# Patient Record
Sex: Female | Born: 1940 | Race: White | Hispanic: No | Marital: Married
Health system: Midwestern US, Community
[De-identification: ages and names within clinical notes are randomized; demographics above are authoritative.]

---

## 2017-07-17 ENCOUNTER — Emergency Department: Admit: 2017-07-17 | Payer: MEDICARE

## 2017-07-17 ENCOUNTER — Inpatient Hospital Stay
Admit: 2017-07-17 | Discharge: 2017-07-17 | Disposition: A | Discharge status: Home / Self Care | Payer: MEDICARE | Attending: Emergency Medicine

## 2017-07-17 DIAGNOSIS — S42291A Other displaced fracture of upper end of right humerus, initial encounter for closed fracture: Secondary | ICD-10-CM

## 2017-07-17 MED ORDER — OXYCODONE-ACETAMINOPHEN 5 MG-325 MG TAB
5-325 mg | ORAL_TABLET | ORAL | 0 refills | Status: AC
Start: 2017-07-17 — End: ?

## 2017-07-17 MED ORDER — OXYCODONE-ACETAMINOPHEN 5 MG-325 MG TAB
5-325 mg | ORAL | Status: AC
Start: 2017-07-17 — End: 2017-07-17
  Administered 2017-07-17: 21:00:00 via ORAL

## 2017-07-17 MED FILL — OXYCODONE-ACETAMINOPHEN 5 MG-325 MG TAB: 5-325 mg | ORAL | Qty: 1

## 2017-07-17 NOTE — ED Provider Notes (Addendum)
HPI     Pt is 76 yo female with PMH sig for bilateral breast ca who presents to ED c/o right upper arm/shoulder pain s/p mechanical trip and fall onto the ground today. She notes pain with attempting to move her right shoulder. She denies other injuries, numbness, tingling, weakness, color changes to extremity. Pt states she took OTC pain killers before arrival to ED.     Past Medical History:   Diagnosis Date   ??? Cancer (HCC)     BREAST- BIL   ??? Hypothyroid        Past Surgical History:   Procedure Laterality Date   ??? HX BREAST LUMPECTOMY     ??? HX HYSTERECTOMY           Family History:   Problem Relation Age of Onset   ??? Cancer Father    ??? Cancer Brother    ??? Cancer Maternal Uncle    ??? Cancer Paternal Uncle        Social History     Social History   ??? Marital status: MARRIED     Spouse name: N/A   ??? Number of children: N/A   ??? Years of education: N/A     Occupational History   ??? Not on file.     Social History Main Topics   ??? Smoking status: Never Smoker   ??? Smokeless tobacco: Never Used   ??? Alcohol use Yes      Comment: OCCATIONALLY   ??? Drug use: No   ??? Sexual activity: Not on file     Other Topics Concern   ??? Not on file     Social History Narrative   ??? No narrative on file         ALLERGIES: Review of patient's allergies indicates no known allergies.    Review of Systems   Constitutional: Negative for chills and fatigue.   Musculoskeletal: Positive for arthralgias ( right upper arm and shoulder pain). Negative for back pain, gait problem, joint swelling, myalgias, neck pain and neck stiffness.   Skin: Negative for color change, pallor, rash and wound.   Neurological: Negative for seizures, syncope, weakness, light-headedness and numbness.       Vitals:    07/17/17 1520   BP: 111/56   Pulse: 77   Resp: 16   Temp: 98 ??F (36.7 ??C)   SpO2: 98%   Weight: 69.4 kg (153 lb)   Height: 5\' 3"  (1.6 m)            Physical Exam   Constitutional: She is oriented to person, place, and time. She appears  well-developed and well-nourished.   HENT:   Head: Normocephalic and atraumatic.   Cardiovascular: Normal rate, regular rhythm and intact distal pulses.    Musculoskeletal: Normal range of motion. She exhibits tenderness ( +ttp to right proximal humerus). She exhibits no edema or deformity.   Neurological: She is alert and oriented to person, place, and time. She has normal strength. No sensory deficit. Coordination normal. GCS eye subscore is 4. GCS verbal subscore is 5. GCS motor subscore is 6.   Skin: No rash noted. No erythema. No pallor.   Nursing note and vitals reviewed.       MDM  Number of Diagnoses or Management Options  Other closed displaced fracture of proximal end of right humerus, initial encounter: minor     Amount and/or Complexity of Data Reviewed  Tests in the radiology section of CPT??: ordered and reviewed  Discussion of test results with the performing providers: yes  Decide to obtain previous medical records or to obtain history from someone other than the patient: yes  Discuss the patient with other providers: yes  Independent visualization of images, tracings, or specimens: yes    Risk of Complications, Morbidity, and/or Mortality  Presenting problems: minimal  Diagnostic procedures: minimal  Management options: minimal    Patient Progress  Patient progress: stable        ED Course       Procedures    I, Aline August, PA, reviewed the patient's past history, allergies and home medications as documented in the nursing chart.    Radiology:  XR SHOULDER RT AP/LAT MIN 2 V (Final result) Result time: 07/17/17 16:26:18   ?? Final result by Lane Hacker, MD (07/17/17 16:26:18)   ?? Impression:   ?? impression:    Fracture proximal humerus   ?? Narrative:   ?? Right shoulder 2 views clinical indication trauma    Study again demonstrates a transverse fracture of the proximal humerus     ??    ??    ?? XR HUMERUS RT (Final result) Result time: 07/17/17 16:25:10    ?? Final result by Lane Hacker, MD (07/17/17 16:25:10)   ?? Impression:   ?? IMPRESSION:    Fracture of proximal humerus   ?? Narrative:   ?? Right humerus 2 views clinical indication trauma    Study demonstrates a transverse fracture of the proximal humeral shaft with  overriding of fracture fragments. The humeral head appears to be within the the  shoulder joint space        <EMERGENCY DEPARTMENT CASE SUMMARY>    Impression/Differential Diagnosis: contusion of arm, sprain of arm, r/o fracture of arm    ED Course: x-ray, sling for comfort, reassess    X-ray shows fracture of proximal humerus. There is displacement of fracture fragments.     Spoke to Dr. Maryelizabeth Kaufmann from orthopedics who states she will likely need surgery to fix the fracture but she can be sent home with pain medication and shoulder immobilizer. Pt placed in immobilizer and given one dose of percocet in ED for pain, will rx more pain medication for the next few days.     Instructed pt return to ED immediately for numbness, tingling, color changes to extremity.    10:57 PM: Patient was reassessed prior to discharge. Patient???s symptoms Improved. I personally discussed test results with patient/guardian, who understands instructions. All questions were answered.  Patient/guardian advised to follow up with PMD and/or specialist. Patient/guardian instructed to return to the ER if symptoms persist, change or worsen. Patient agrees with plan.      Final Impression/Diagnosis:   Encounter Diagnoses     ICD-10-CM ICD-9-CM   1. Other closed displaced fracture of proximal end of right humerus, initial encounter S42.291A 812.09       Patient condition at time of disposition: improved    I have reviewed the following home medications:    Prior to Admission medications    Medication Sig Start Date End Date Taking? Authorizing Provider   aspirin 81 mg chewable tablet Take 81 mg by mouth daily.   Yes Phys Other, MD    magnesium 250 mg tab Take 250 mg by mouth daily.   Yes Phys Other, MD   levothyroxine (LEVOXYL) 88 mcg tablet Take 88 mcg by mouth Daily (before breakfast).   Yes Phys Other, MD   fluticasone propionate (FLONASE NA) by Nasal  route as needed.   Yes Phys Other, MD   oxyCODONE-acetaminophen (PERCOCET) 5-325 mg per tablet 1 Tab Oral every 4 - 6 hours with food as needed for pain  Indications: Pain 07/17/17  Yes Flossie Buffy, PA       I, Aline August, PA attest that all of the information above was obtained by myself under the supervision of Dr. Darnelle Bos. I have ordered and reviewed the diagnostic studies, unless otherwise noted.     I was personally available for consultation in the emergency department.  I have reviewed the chart and agree with the documentation recorded by the [facility], including the assessment, treatment plan, and disposition.  Darnelle Bos, MD

## 2017-07-17 NOTE — ED Triage Notes (Addendum)
Pt was walking up steps and fell and hit right shoulder on ground which was concrete.  C/O Right shoulder an upper arm pain

## 2017-07-17 NOTE — ED Notes (Signed)
Patient discharged by [facility] RN

## 2024-02-08 IMAGING — MR MRI LUMBAR SPINE WITHOUT CONTRAST
6 of 8 series · 11 of 48 positions shown · IV contrast (gadolinium)
Comparison: None

________________________________________________________________________________________________ 
MRI LUMBAR SPINE WITHOUT CONTRAST, 02/08/2024 [DATE]: 
CLINICAL INDICATION: Radiculopathy, Lumbar Region , breast cancer. Low back pain 
radiates to both buttocks.
TECHNIQUE: Multiplanar, multiecho position MR images of the lumbar spine were 
performed without intravenous gadolinium enhancement. Patient was scanned on a 
1.5T magnet

[Series 101: survey · axial · 10.0mm · 1.25mm/px · 1 of 10 slices shown]
[im 1/10]
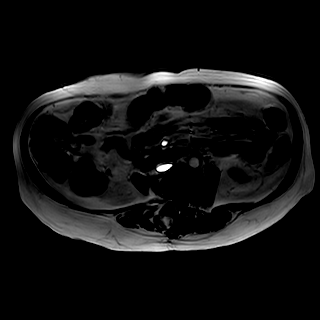

[Series 201: t2w_cor-surv · coronal · 6.0mm · 0.62mm/px · 1 of 10 slices shown]
[im 1/10]
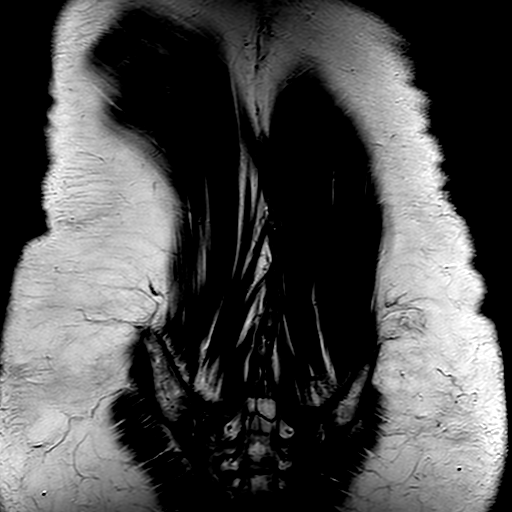

[Series 301: t1_tse_sag · sagittal · 4.5mm · 0.33mm/px · 2 of 19 slices shown]
[im 1/19]
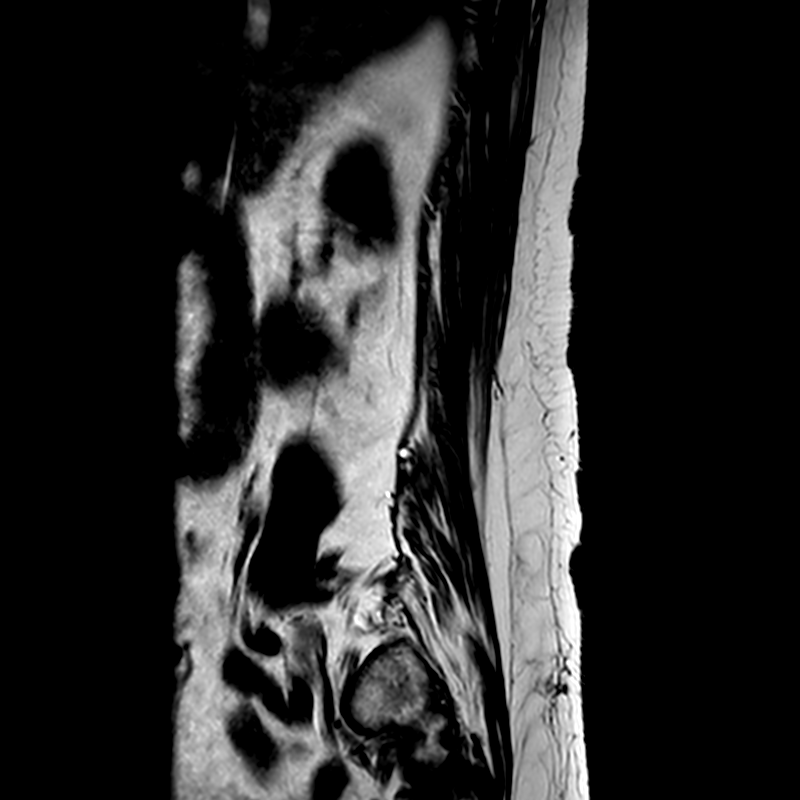
[im 19/19]
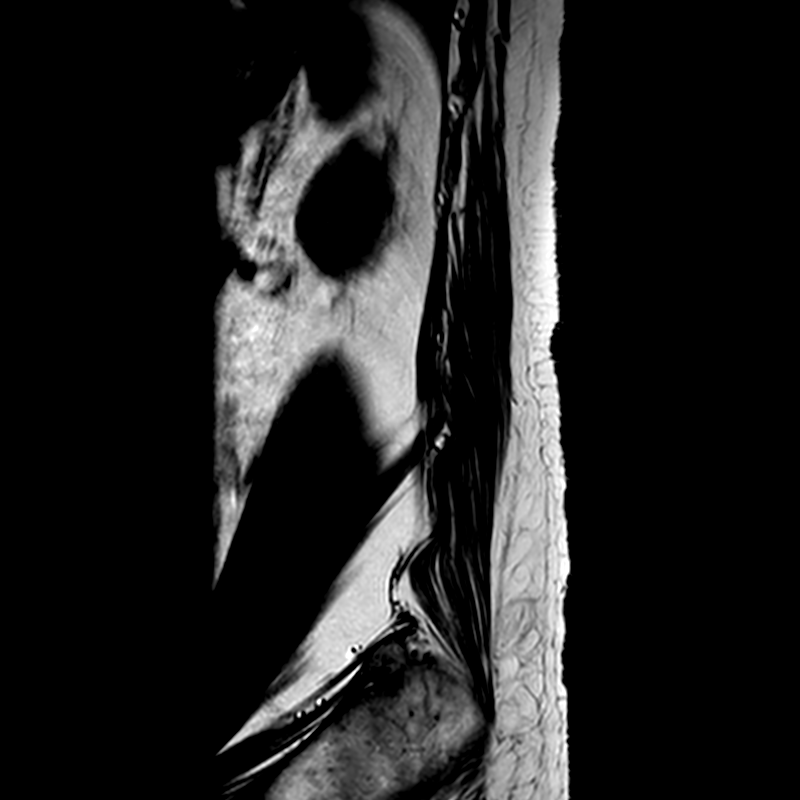

[Series 402: (id)_mdixon_tse · sagittal · 4.5mm · 0.49mm/px · 3 of 19 slices shown]
[im 1/19]
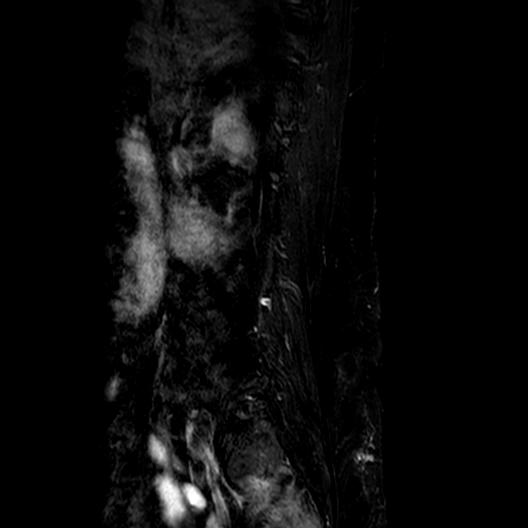
[im 10/19]
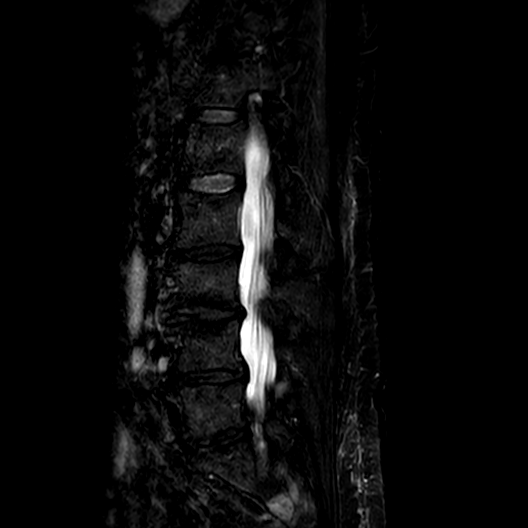
[im 19/19]
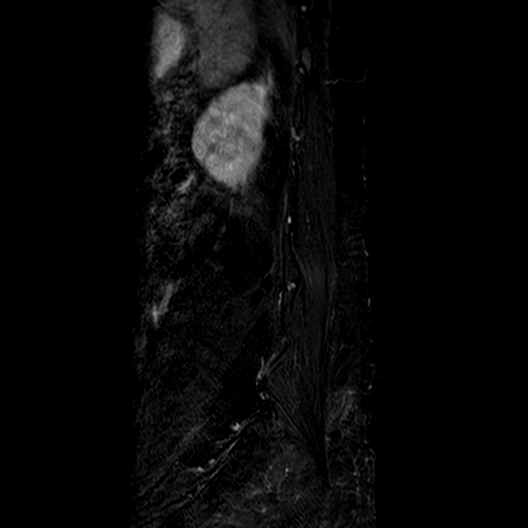

[Series 403: st2w_mdixon_tse · sagittal · 4.5mm · 0.49mm/px · 3 of 19 slices shown]
[im 1/19]
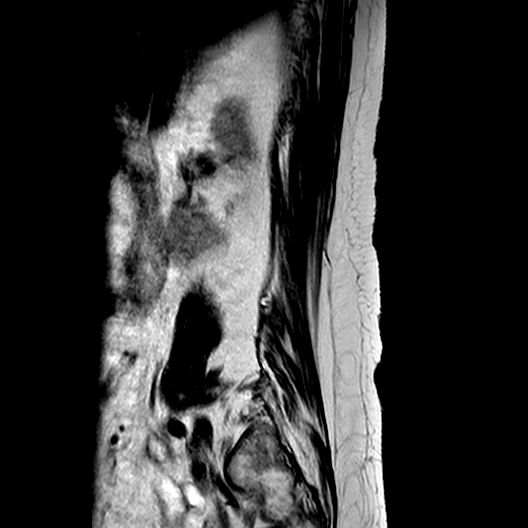
[im 10/19]
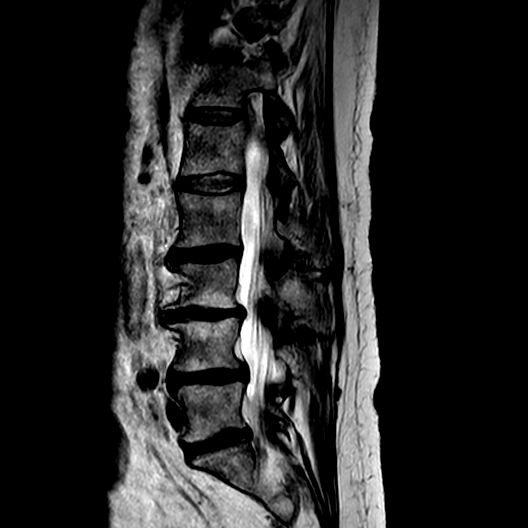
[im 19/19]
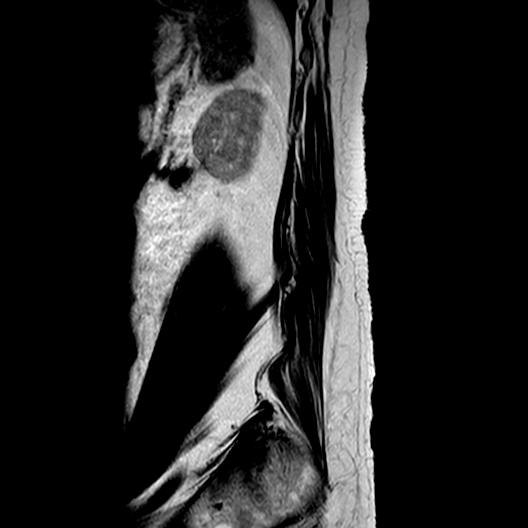

[Series 502: (id) view_ax mpr · axial · 1.0mm · 0.25mm/px · 1 of 137 slices shown]
[im 9/137]
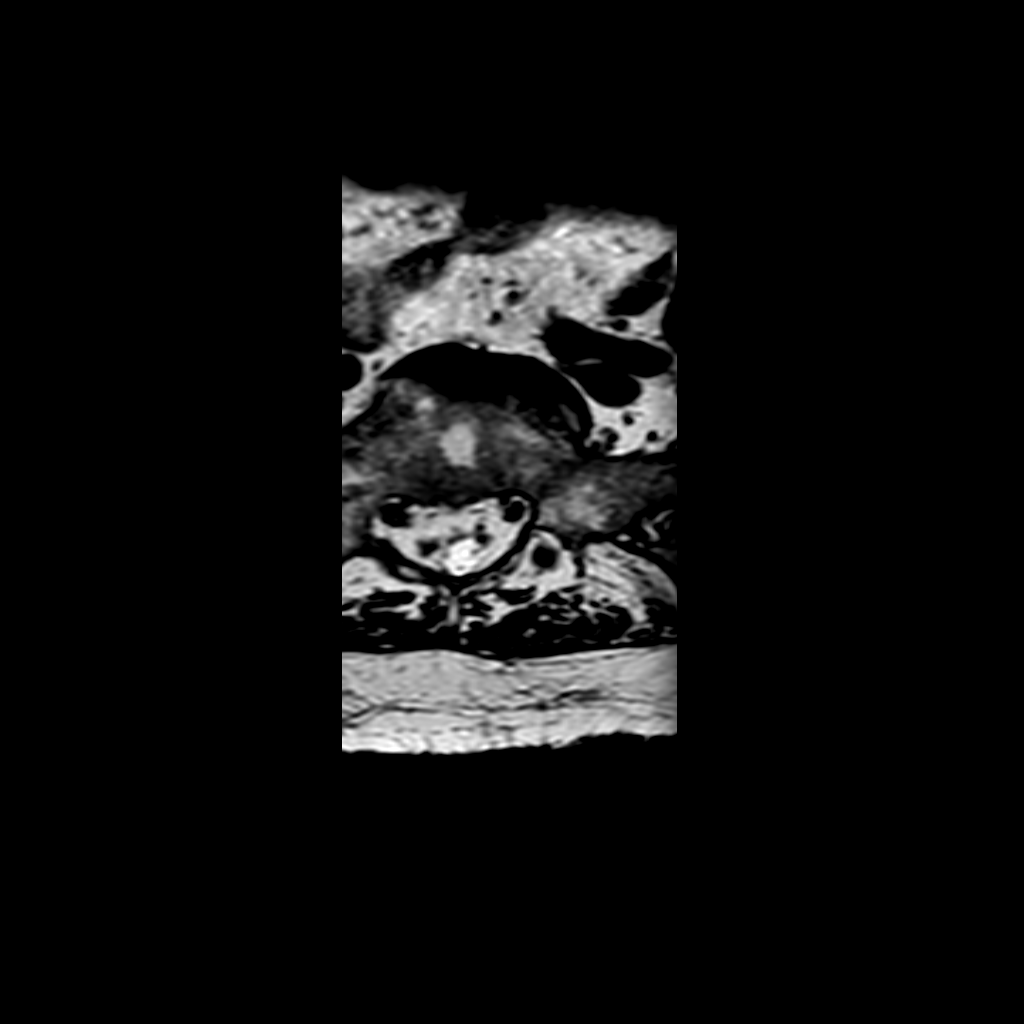

[11 of 48 positions shown; findings below may reference images not displayed]

FINDINGS: -------------------------------------------------------------------------------- 
------ 
GENERAL: 
Nomenclature is based on 5 lumbar type vertebral bodies.     
ALIGNMENT: Moderate levoconvex lumbar scoliosis. There is slight left lateral 
translation L4 on L5. Loss of the normal lumbar lordosis with grade 1 
anterolisthesis L3 on L4 and L4 on L5. 
VERTEBRAL BODY HEIGHT: Normal.  
MARROW SIGNAL: There is hyperintense T2 signal within the lateral aspects of the 
sacrum, suggestive of sacral insufficiency fracture, although incompletely 
visualized. 
CORD SIGNAL: Normal distal spinal cord and cauda equina. Conus medullaris 
terminates at superior L2.. 
ADDITIONAL FINDINGS: Left renal cysts. Right parapelvic renal cyst.. Sacral 
Tarlov cysts. 
Modic I-II: L1-L2, L2-L3, L3-L4, L4-L5 and L5-S1. 
Ligamentum Flavum > 2.5 mm: All levels. 
-------------------------------------------------------------------------------- 
------ 
SEGMENTAL: 
T12-L1: Normal disc height and signal. No herniation. Normal facets. No spinal 
canal or neural foraminal stenosis. 
L1-L2: Normal disc height and signal. No herniation. Normal facets. No spinal 
canal or neural foraminal stenosis. 
L2-L3: Loss of disc height and signal. Minimal annular bulge. Canal and foramina 
are patent with normal facets. 
L3-L4: Loss of disc height and signal with Schmorls nodes. Mild narrowing right 
lateral recess. Canal otherwise patent. Mild facet arthropathy. Borderline right 
foraminal narrowing. Left foramen is patent. 
L4-L5: Loss of disc height and signal. Posterior osteophytic ridging. Mild 
diffuse annular bulge. Mild narrowing left lateral recess. Borderline canal 
stenosis. Facet arthropathy. Moderate right foraminal narrowing. Mild to 
moderate left foraminal narrowing. 
L5-S1: Loss of disc signal. Right foramen patent. Mild left foraminal narrowing 
with left foraminal disc protrusion. Canal patent. Facet arthropathy. 
-------------------------------------------------------------------------------- 
------
IMPRESSION: Sacral insufficiency fracture, although incompletely visualized. Depending on 
the clinical scenario and if it would change clinical management, could consider 
dedicated MRI sacrum. 
Degenerative and scoliotic changes but without significant canal stenosis. 
Variable lateral recess and foraminal narrowing as detailed above.
